# Patient Record
Sex: Male | Born: 1960 | Race: White | Hispanic: No | Marital: Married | Smoking: Current every day smoker
Health system: Southern US, Community
[De-identification: ages and names within clinical notes are randomized; demographics above are authoritative.]

## PROBLEM LIST (undated history)

## (undated) DIAGNOSIS — R51 Headache: Secondary | ICD-10-CM

## (undated) DIAGNOSIS — R519 Headache, unspecified: Secondary | ICD-10-CM

## (undated) HISTORY — DX: Headache: R51

## (undated) HISTORY — PX: CLUB FOOT RELEASE: SHX1363

## (undated) HISTORY — DX: Headache, unspecified: R51.9

---

## 2012-11-17 ENCOUNTER — Ambulatory Visit (INDEPENDENT_AMBULATORY_CARE_PROVIDER_SITE_OTHER): Payer: Managed Care, Other (non HMO) | Admitting: Family Medicine

## 2012-11-17 VITALS — BP 120/78 | HR 76 | Temp 98.6°F | Resp 16 | Ht 69.0 in | Wt 178.0 lb

## 2012-11-17 DIAGNOSIS — H1132 Conjunctival hemorrhage, left eye: Secondary | ICD-10-CM

## 2012-11-17 DIAGNOSIS — H1130 Conjunctival hemorrhage, unspecified eye: Secondary | ICD-10-CM

## 2012-11-17 NOTE — Patient Instructions (Addendum)
You appear to have a subconjunctival hemorrhage. These are generally harmless, but can take a few weeks to resolve.  If you are not getting yearly eye exams it would be wise to start.   Let me know if you have any other problems or any vision changes  Subconjunctival Hemorrhage A subconjunctival hemorrhage is a bright red patch covering a portion of the white of the eye. The white part of the eye is called the sclera, and it is covered by a thin membrane called the conjunctiva. This membrane is clear, except for tiny blood vessels that you can see with the naked eye. When your eye is irritated or inflamed and becomes red, it is because the vessels in the conjunctiva are swollen. Sometimes, a blood vessel in the conjunctiva can break and bleed. When this occurs, the blood builds up between the conjunctiva and the sclera, and spreads out to create a red area. The red spot may be very small at first. It may then spread to cover a larger part of the surface of the eye, or even all of the visible white part of the eye. In almost all cases, the blood will go away and the eye will become white again. Before completely dissolving, however, the red area may spread. It may also become brownish-yellow in color, before going away. If a lot of blood collects under the conjunctiva, it may look like a bulge on the surface of the eye. This looks scary, but it will also eventually flatten out and go away. Subconjunctival hemorrhages do not cause pain, but if swollen, may cause a feeling of irritation. There is no effect on vision.  CAUSES   The most common cause is mild trauma (rubbing the eye, irritation).  Subconjunctival hemorrhages can happen because of coughing or straining (lifting heavy objects), vomiting, or sneezing.  In some cases, your doctor may want to check your blood pressure. High blood pressure can also cause a sunconjunctival hemorrhage.  Severe trauma or blunt injuries.  Diseases that affect blood  clotting (hemophilia, leukemia).  Abnormalities of blood vessels behind the eye (carotid cavernous sinus fistula).  Tumors behind the eye.  Certain drugs (aspirin, coumadin, heparin).  Recent eye surgery. HOME CARE INSTRUCTIONS   Do not worry about the appearance of your eye. You may continue your usual activities.  Often, follow-up is not necessary. SEEK MEDICAL CARE IF:   Your eye becomes painful.  The bleeding does not disappear within 3 weeks.  Bleeding occurs elsewhere, for example, under the skin, in the mouth, or in the other eye.  You have recurring subconjunctival hemorrhages. SEEK IMMEDIATE MEDICAL CARE IF:   Your vision changes or you have difficulty seeing.  You develop severe headache, persistent vomiting, confusion, or abnormal drowsiness (lethargy).  Your eye seems to bulge or protrude from the eye socket.  You notice the sudden appearance of bruises, or have spontaneous bleeding elsewhere on your body. Document Released: 07/16/2005 Document Revised: 10/08/2011 Document Reviewed: 06/13/2009 [facility] Patient Information 2013 [place], [place].

## 2012-11-17 NOTE — Progress Notes (Signed)
[facility] [address], [place] [address] [phone]  Date:  11/17/2012   Name:  Stephen Krause   DOB:  10/27/1960   MRN:  [id number]  PCP:  No PCP Per Patient    Chief Complaint: red eye   History of Present Illness:  Stephen Krause is a 52 y.o. very pleasant male patient who presents with the following:  Last week he was doing some sanding and got some metal dust in his left eye- he thought he was able to blink it all out and did not notice any pain or persistent FB sensation.  The next day the eye seemed fine.  However Saturday am he noted some redness in just the left eye which has persisted.  The eye is not painful.  Today is Monday The eye feels a little bit irritated but nothing severe He uses reading glasses only, no rx lenses.  He does get annual eye exmas No crusting, no discharge No vision change He does have a little photophobia but no severe  He is otherwise generally healthy, has not yet tried any drops or other treatment.   His daughter and wife have been ill redently, but he has been feeling otherwise fine.    There is no problem list on file for this patient.   No past medical history on file.  Past Surgical History  Procedure Laterality Date  . Club foot release      History  Substance Use Topics  . Smoking status: Current Every Day Smoker -- 1.50 packs/day    Types: Cigarettes  . Smokeless tobacco: Not on file  . Alcohol Use: No    No family history on file.  Allergies  Allergen Reactions  . Erythromycin   . Penicillins     Medication list has been reviewed and updated.  No current outpatient prescriptions on file prior to visit.   No current facility-administered medications on file prior to visit.    Review of Systems:  As per HPI- otherwise negative. Generally healthy except for smoking  Physical Examination: Filed Vitals:   11/17/12 1405  BP: 120/78  Pulse: 76  Temp: 98.6 F (37 C)  Resp: 16    Filed Vitals:   11/17/12 1405  Height: 5\' 9"  (1.753 m)  Weight: 178 lb (80.74 kg)   Body mass index is 26.27 kg/(m^2). Ideal Body Weight: Weight in (lb) to have BMI = 25: 168.9  GEN: WDWN, NAD, Non-toxic, A & O x 3 HEENT: Atraumatic, Normocephalic. Neck supple. No masses, No LAD.  TM wnl bilaterally, oropharynx wnl Ears and Nose: No external deformity. CV: RRR, No M/G/R. No JVD. No thrill. No extra heart sounds. PULM: CTA B, no wheezes, crackles, rhonchi. No retractions. No resp. distress. No accessory muscle use. EXTR: No c/c/e NEURO Normal gait.  PSYCH: Normally interactive. Conversant. Not depressed or anxious appearing.  Calm demeanor.  Left eye: subconjunctival hemorrhage in the nasal/ inferior part of the sclera. Bilaterally: PEERL, EOMI, fundoscopic exam normal. No lid swelling or edema Negative fluorescin left eye  Assessment and Plan: Subconjunctival hemorrhage, left  Reassurance, anticipatory guidance.  He is not taking any blood thinners, no other unusual bleeding or bruising.  Continue annual exam exams.  Let me know if not getting better or if getting worse.    Signed Abbe Amsterdam, MD

## 2013-10-15 ENCOUNTER — Telehealth: Payer: Self-pay | Admitting: *Deleted

## 2013-10-15 NOTE — Telephone Encounter (Signed)
Called and spoke with patient to confirm new patient appointment for tomorrow. Patient confirmed. JG//CMA

## 2013-10-16 ENCOUNTER — Encounter: Payer: Self-pay | Admitting: Family Medicine

## 2013-10-16 ENCOUNTER — Telehealth: Payer: Self-pay

## 2013-10-16 ENCOUNTER — Ambulatory Visit (INDEPENDENT_AMBULATORY_CARE_PROVIDER_SITE_OTHER): Payer: Private Health Insurance - Indemnity | Admitting: Family Medicine

## 2013-10-16 VITALS — BP 118/84 | HR 74 | Temp 98.1°F | Resp 16 | Ht 70.0 in | Wt 182.1 lb

## 2013-10-16 DIAGNOSIS — M62838 Other muscle spasm: Secondary | ICD-10-CM | POA: Insufficient documentation

## 2013-10-16 DIAGNOSIS — M25512 Pain in left shoulder: Secondary | ICD-10-CM | POA: Insufficient documentation

## 2013-10-16 DIAGNOSIS — F17200 Nicotine dependence, unspecified, uncomplicated: Secondary | ICD-10-CM

## 2013-10-16 DIAGNOSIS — M25519 Pain in unspecified shoulder: Secondary | ICD-10-CM

## 2013-10-16 DIAGNOSIS — L989 Disorder of the skin and subcutaneous tissue, unspecified: Secondary | ICD-10-CM

## 2013-10-16 LAB — CBC WITH DIFFERENTIAL/PLATELET
BASOS ABS: 0 10*3/uL (ref 0.0–0.1)
Basophils Relative: 0.5 % (ref 0.0–3.0)
Eosinophils Absolute: 0.2 10*3/uL (ref 0.0–0.7)
Eosinophils Relative: 3.1 % (ref 0.0–5.0)
HCT: 45.9 % (ref 39.0–52.0)
Hemoglobin: 15.2 g/dL (ref 13.0–17.0)
Lymphocytes Relative: 37.7 % (ref 12.0–46.0)
Lymphs Abs: 2.8 10*3/uL (ref 0.7–4.0)
MCHC: 33.1 g/dL (ref 30.0–36.0)
MCV: 92 fl (ref 78.0–100.0)
MONOS PCT: 6.7 % (ref 3.0–12.0)
Monocytes Absolute: 0.5 10*3/uL (ref 0.1–1.0)
Neutro Abs: 3.9 10*3/uL (ref 1.4–7.7)
Neutrophils Relative %: 52 % (ref 43.0–77.0)
PLATELETS: 242 10*3/uL (ref 150.0–400.0)
RBC: 5 Mil/uL (ref 4.22–5.81)
RDW: 13.8 % (ref 11.5–14.6)
WBC: 7.4 10*3/uL (ref 4.5–10.5)

## 2013-10-16 LAB — BASIC METABOLIC PANEL
BUN: 13 mg/dL (ref 6–23)
CO2: 28 meq/L (ref 19–32)
Calcium: 9.5 mg/dL (ref 8.4–10.5)
Chloride: 103 mEq/L (ref 96–112)
Creatinine, Ser: 0.9 mg/dL (ref 0.4–1.5)
GFR: 95.19 mL/min (ref >60.00)
GLUCOSE: 86 mg/dL (ref 70–99)
POTASSIUM: 4 meq/L (ref 3.5–5.1)
SODIUM: 138 meq/L (ref 135–145)

## 2013-10-16 LAB — LIPID PANEL
CHOL/HDL RATIO: 2
Cholesterol: 149 mg/dL (ref 0–200)
HDL: 62.4 mg/dL (ref >39.00)
LDL Cholesterol: 75 mg/dL (ref 0–99)
Triglycerides: 57 mg/dL (ref 0.0–149.0)
VLDL: 11.4 mg/dL (ref 0.0–40.0)

## 2013-10-16 LAB — HEPATIC FUNCTION PANEL
ALBUMIN: 4.5 g/dL (ref 3.5–5.2)
ALK PHOS: 98 U/L (ref 39–117)
ALT: 26 U/L (ref 0–53)
AST: 25 U/L (ref 0–37)
Bilirubin, Direct: 0 mg/dL (ref 0.0–0.3)
Total Bilirubin: 0.3 mg/dL (ref 0.3–1.2)
Total Protein: 7.1 g/dL (ref 6.0–8.3)

## 2013-10-16 LAB — TSH: TSH: 0.88 u[IU]/mL (ref 0.35–5.50)

## 2013-10-16 MED ORDER — CYCLOBENZAPRINE HCL 10 MG PO TABS: 10.0000 mg | ORAL_TABLET | Freq: Three times a day (TID) | ORAL | Status: AC | PRN

## 2013-10-16 MED ORDER — MELOXICAM 15 MG PO TABS: 15.0000 mg | ORAL_TABLET | Freq: Every day | ORAL | Status: AC

## 2013-10-16 NOTE — Telephone Encounter (Signed)
Left message for call back Non-identifiable   NEW PATIENT 

## 2013-10-16 NOTE — Progress Notes (Signed)
   Subjective:    Patient ID: Stephen Krause, male    DOB: Aug 12, 1960, 53 y.o.   MRN: [id number]  HPI New to establish.  Previous PCP- [facility] She[facility]  Neck stiffness- intermittent, will cause HAs.  Central.  Pt feels sxs are worse w/ dehydration.  sxs tend to occur every 2 weeks.  Improves w/ NSAID use.  L shoulder- injured arm last summer while moving trees.  Saw [facility], xrays were normal and pt got home exercise program.  Still having pain w/ overhead motion, putting on a coat, etc.  Skin lesion- on R thigh, not painful.  Not growing but 'is changing'- becoming rough and protruding from skin.  No bleeding.  Not itchy.  + family hx of melanoma.  Area is 'reddish' in color.  Tobacco use- smokes 2 ppd x20 yrs.  Thinking about quitting.  Denies CP, SOB, chronic cough   Review of Systems For ROS see HPI     Objective:   Physical Exam  Vitals reviewed. Constitutional: He is oriented to person, place, and time. He appears well-developed and well-nourished. No distress.  Smells of tobacco  Neck: Normal range of motion. Neck supple. No thyromegaly present.  Mild trap spasm bilaterally  Cardiovascular: Normal rate, regular rhythm, normal heart sounds and intact distal pulses.   Pulmonary/Chest: Effort normal and breath sounds normal. No respiratory distress. He has no wheezes. He has no rales.  Musculoskeletal:  L shoulder w/ painful abduction, external rotation > internal rotation  Lymphadenopathy:    He has no cervical adenopathy.  Neurological: He is alert and oriented to person, place, and time. No cranial nerve deficit. Coordination normal.  Skin: Skin is warm and dry.  <0.5 cm keratosis on R anterior thigh- not at all suspicious for melanoma  Psychiatric: He has a normal mood and affect. His behavior is normal.          Assessment & Plan:

## 2013-10-16 NOTE — Progress Notes (Signed)
Pre visit review using our clinic review tool, if applicable. No additional management support is needed unless otherwise documented below in the visit note. 

## 2013-10-16 NOTE — Patient Instructions (Signed)
Schedule your complete physical in 6 months We'll notify you of your lab results and make any changes if needed We'll call you with your Orthopedic appt Start the Mobic daily for pain and inflammation Use the Flexeril at night for muscle spasm- will cause drowsiness Heating pad on neck or shoulder as needed Try and quit smoking! Call with any questions or concerns Welcome!  We're glad to have you!

## 2013-10-16 NOTE — Assessment & Plan Note (Signed)
New to provider, ongoing for pt.  He is now smoking 2 ppd.  Pt reports he is interested in quitting but is not interested in medication to assist at this time.  Will follow and address at future visits.

## 2013-10-16 NOTE — Assessment & Plan Note (Signed)
New to provider, ongoing for pt.  Has seen [facility] and done home exercise program but he continues to have pain and limited ROM.  Start daily NSAID.  Refer back to [facility].  Will follow.

## 2013-10-16 NOTE — Assessment & Plan Note (Signed)
New.  No evidence of melanoma.  Appears to be keratosis.  No need for intervention at this time.  Pt to monitor for change.

## 2013-10-16 NOTE — Assessment & Plan Note (Signed)
New.  Start scheduled daily NSAIDs, add flexeril prn.  Heat prn.  Reviewed supportive care and red flags that should prompt return.  Pt expressed understanding and is in agreement w/ plan.

## 2013-10-19 ENCOUNTER — Telehealth: Payer: Self-pay | Admitting: Family Medicine

## 2013-10-19 ENCOUNTER — Encounter: Payer: Self-pay | Admitting: General Practice

## 2013-10-19 NOTE — Telephone Encounter (Signed)
Relevant patient education mailed to patient.  

## 2013-10-21 NOTE — Telephone Encounter (Signed)
Unable to reach the patient pre visit.

## 2021-10-03 ENCOUNTER — Other Ambulatory Visit (HOSPITAL_BASED_OUTPATIENT_CLINIC_OR_DEPARTMENT_OTHER): Payer: Self-pay

## 2021-10-03 MED ORDER — AZITHROMYCIN 250 MG PO TABS
ORAL_TABLET | ORAL | 0 refills | Status: AC
  Filled 2021-10-03: qty 6, 5d supply, fill #0

## 2021-10-10 ENCOUNTER — Other Ambulatory Visit: Payer: Self-pay | Admitting: *Deleted

## 2021-10-10 DIAGNOSIS — Z87891 Personal history of nicotine dependence: Secondary | ICD-10-CM

## 2021-10-10 DIAGNOSIS — F17210 Nicotine dependence, cigarettes, uncomplicated: Secondary | ICD-10-CM

## 2021-10-24 ENCOUNTER — Encounter: Payer: Self-pay | Admitting: Acute Care

## 2021-10-24 ENCOUNTER — Ambulatory Visit (INDEPENDENT_AMBULATORY_CARE_PROVIDER_SITE_OTHER): Payer: Managed Care, Other (non HMO) | Admitting: Acute Care

## 2021-10-24 DIAGNOSIS — F17210 Nicotine dependence, cigarettes, uncomplicated: Secondary | ICD-10-CM

## 2021-10-24 NOTE — Progress Notes (Signed)
Virtual Visit via Telephone Note ? ?I connected with Stephen Krause on 10/24/21 at 11:00 AM EDT by telephone and verified that I am speaking with the correct person using two identifiers. ? ?Location: ?Patient:  At home ?Provider: Working from home ?  ?I discussed the limitations, risks, security and privacy concerns of performing an evaluation and management service by telephone and the availability of in person appointments. I also discussed with the patient that there may be a patient responsible charge related to this service. The patient expressed understanding and agreed to proceed. ? ? ? ?Shared Decision Making Visit Lung Cancer Screening Program ?[phone]) ? ? ?Eligibility: ?Age 61 y.o. ?Pack Years Smoking History Calculation 40 pack year smoking history ?(# packs/per year x # years smoked) ?Recent History of coughing up blood  no ?Unexplained weight loss? no ?( >Than 15 pounds within the last 6 months ) ?Prior History Lung / other cancer no ?(Diagnosis within the last 5 years already requiring surveillance chest CT Scans). ?Smoking Status Current Smoker ?Former Smokers: Years since quit:  NA ? Quit Date:  NA ? ?Visit Components: ?Discussion included one or more decision making aids. yes ?Discussion included risk/benefits of screening. yes ?Discussion included potential follow up diagnostic testing for abnormal scans. yes ?Discussion included meaning and risk of over diagnosis. yes ?Discussion included meaning and risk of False Positives. yes ?Discussion included meaning of total radiation exposure. yes ? ?Counseling Included: ?Importance of adherence to annual lung cancer LDCT screening. yes ?Impact of comorbidities on ability to participate in the program. yes ?Ability and willingness to under diagnostic treatment. yes ? ?Smoking Cessation Counseling: ?Current Smokers:  ?Discussed importance of smoking cessation. yes ?Information about tobacco cessation classes and interventions provided to patient.  yes ?Patient provided with "ticket" for LDCT Scan. yes ?Symptomatic Patient. no ? Counseling NA ?Diagnosis Code: Tobacco Use Z72.0 ?Asymptomatic Patient yes ? Counseling (Intermediate counseling: > three minutes counseling) [id number] ?Former Smokers:  ?Discussed the importance of maintaining cigarette abstinence. yes ?Diagnosis Code: Personal History of Nicotine Dependence. U98.119Z87.891 ?Information about tobacco cessation classes and interventions provided to patient. Yes ?Patient provided with "ticket" for LDCT Scan. yes ?Written Order for Lung Cancer Screening with LDCT placed in Epic. Yes ?(CT Chest Lung Cancer Screening Low Dose W/O CM) [id number] ?Z12.2-Screening of respiratory organs ?Z87.891-Personal history of nicotine dependence ? ? ?I have spent 25 minutes of face to face/ virtual visit   time with Mr. Stephen Krause discussing the risks and benefits of lung cancer screening. We viewed / discussed a power point together that explained in detail the above noted topics. We paused at intervals to allow for questions to be asked and answered to ensure understanding.We discussed that the single most powerful action that he can take to decrease his risk of developing lung cancer is to quit smoking. We discussed whether or not he is ready to commit to setting a quit date. We discussed options for tools to aid in quitting smoking including nicotine replacement therapy, non-nicotine medications, support groups, Quit Smart classes, and behavior modification. We discussed that often times setting smaller, more achievable goals, such as eliminating 1 cigarette a day for a week and then 2 cigarettes a day for a week can be helpful in slowly decreasing the number of cigarettes smoked. This allows for a sense of accomplishment as well as providing a clinical benefit. I provided  him  with smoking cessation  information  with contact information for community resources, classes, free nicotine replacement therapy, and access to  mobile apps,  text messaging, and on-line smoking cessation help. I have also provided  him  the office contact information in the event he needs to contact me, or the screening staff. We discussed the time and location of the scan, and that either Doroteo Glassman RN, Joella Prince, RN  or I will call / send a letter with the results within 24-72 hours of receiving them. The patient verbalized understanding of all of  the above and had no further questions upon leaving the office. They have my contact information in the event they have any further questions. ? ?I spent 3 minutes counseling on smoking cessation and the health risks of continued tobacco abuse. ? ?I explained to the patient that there has been a high incidence of coronary artery disease noted on these exams. I explained that this is a non-gated exam therefore degree or severity cannot be determined. This patient is not on statin therapy. I have asked the patient to follow-up with their PCP regarding any incidental finding of coronary artery disease and management with diet or medication as their PCP  feels is clinically indicated. The patient verbalized understanding of the above and had no further questions upon completion of the visit. ? ?  ? ? ? ?Magdalen Spatz, NP ?10/24/2021 ? ? ? ? ? ? ?

## 2021-10-24 NOTE — Patient Instructions (Signed)
Thank you for participating in the [facility] Cancer Screening Program. °It was our pleasure to meet you today. °We will call you with the results of your scan within the next few days. °Your scan will be assigned a Lung RADS category score by the physicians reading the scans.  °This Lung RADS score determines follow up scanning.  °See below for description of categories, and follow up screening recommendations. °We will be in touch to schedule your follow up screening annually or based on recommendations of our providers. °We will fax a copy of your scan results to your Primary Care Physician, or the physician who referred you to the program, to ensure they have the results. °Please call the office if you have any questions or concerns regarding your scanning experience or results.  °Our office number is [phone]. °Please speak with Denise Phelps, RN. She is our Lung Cancer Screening RN. °If she is unavailable when you call, please have the office staff send her a message. She will return your call at her earliest convenience. °Remember, if your scan is normal, we will scan you annually as long as you continue to meet the criteria for the program. (Age 55-77, Current smoker or smoker who has quit within the last 15 years). °If you are a smoker, remember, quitting is the single most powerful action that you can take to decrease your risk of lung cancer and other pulmonary, breathing related problems. °We know quitting is hard, and we are here to help.  °Please let us know if there is anything we can do to help you meet your goal of quitting. °If you are a former smoker, congratulations. We are proud of you! Remain smoke free! °Remember you can refer friends or family members through the number above.  °We will screen them to make sure they meet criteria for the program. °Thank you for helping us take better care of you by participating in Lung Screening. ° °You can receive free nicotine replacement therapy  ( patches, gum or mints) by calling 1-800-QUIT NOW. Please call so we can get you on the path to becoming  a non-smoker. I know it is hard, but you can do this! ° °Lung RADS Categories: ° °Lung RADS 1: no nodules or definitely non-concerning nodules.  °Recommendation is for a repeat annual scan in 12 months. ° °Lung RADS 2:  nodules that are non-concerning in appearance and behavior with a very low likelihood of becoming an active cancer. °Recommendation is for a repeat annual scan in 12 months. ° °Lung RADS 3: nodules that are probably non-concerning , includes nodules with a low likelihood of becoming an active cancer.  Recommendation is for a 6-month repeat screening scan. Often noted after an upper respiratory illness. We will be in touch to make sure you have no questions, and to schedule your 6-month scan. ° °Lung RADS 4 A: nodules with concerning findings, recommendation is most often for a follow up scan in 3 months or additional testing based on our provider's assessment of the scan. We will be in touch to make sure you have no questions and to schedule the recommended 3 month follow up scan. ° °Lung RADS 4 B:  indicates findings that are concerning. We will be in touch with you to schedule additional diagnostic testing based on our provider's  assessment of the scan. ° °Hypnosis for smoking cessation  °[employer] °[phone] ° °Acupuncture for smoking cessation  °[facility] °[phone]  °

## 2021-10-25 ENCOUNTER — Ambulatory Visit
Admission: RE | Admit: 2021-10-25 | Discharge: 2021-10-25 | Disposition: A | Discharge status: Home / Self Care | Payer: Managed Care, Other (non HMO) | Source: Ambulatory Visit | Attending: Acute Care | Admitting: Acute Care

## 2021-10-25 DIAGNOSIS — Z87891 Personal history of nicotine dependence: Secondary | ICD-10-CM

## 2021-10-25 DIAGNOSIS — F17210 Nicotine dependence, cigarettes, uncomplicated: Secondary | ICD-10-CM

## 2021-10-27 ENCOUNTER — Other Ambulatory Visit: Payer: Self-pay | Admitting: Acute Care

## 2021-10-27 DIAGNOSIS — F17210 Nicotine dependence, cigarettes, uncomplicated: Secondary | ICD-10-CM

## 2022-02-12 ENCOUNTER — Other Ambulatory Visit (HOSPITAL_BASED_OUTPATIENT_CLINIC_OR_DEPARTMENT_OTHER): Payer: Self-pay

## 2022-02-12 MED ORDER — SULFAMETHOXAZOLE-TRIMETHOPRIM 800-160 MG PO TABS
ORAL_TABLET | ORAL | 0 refills | Status: AC
  Filled 2022-02-12: qty 14, 7d supply, fill #0

## 2022-09-11 ENCOUNTER — Other Ambulatory Visit (HOSPITAL_BASED_OUTPATIENT_CLINIC_OR_DEPARTMENT_OTHER): Payer: Self-pay

## 2022-09-11 MED ORDER — NICOTINE 21-14-7 MG/24HR TD KIT
PACK | TRANSDERMAL | 0 refills | Status: AC
  Filled 2022-09-11: qty 1, 30d supply, fill #0

## 2022-09-11 MED ORDER — BUPROPION HCL ER (SR) 150 MG PO TB12
150.0000 mg | ORAL_TABLET | Freq: Every morning | ORAL | 0 refills | Status: AC
  Filled 2022-09-11: qty 60, 60d supply, fill #0

## 2022-09-12 ENCOUNTER — Other Ambulatory Visit (HOSPITAL_BASED_OUTPATIENT_CLINIC_OR_DEPARTMENT_OTHER): Payer: Self-pay

## 2022-09-25 ENCOUNTER — Other Ambulatory Visit (HOSPITAL_BASED_OUTPATIENT_CLINIC_OR_DEPARTMENT_OTHER): Payer: Self-pay

## 2022-10-26 ENCOUNTER — Ambulatory Visit
Admission: RE | Admit: 2022-10-26 | Discharge: 2022-10-26 | Disposition: A | Discharge status: Home / Self Care | Payer: Commercial Managed Care - HMO | Source: Ambulatory Visit | Attending: Acute Care | Admitting: Acute Care

## 2022-10-26 DIAGNOSIS — F17210 Nicotine dependence, cigarettes, uncomplicated: Secondary | ICD-10-CM

## 2022-10-30 ENCOUNTER — Other Ambulatory Visit: Payer: Self-pay

## 2022-10-30 DIAGNOSIS — Z87891 Personal history of nicotine dependence: Secondary | ICD-10-CM

## 2022-10-30 DIAGNOSIS — F17210 Nicotine dependence, cigarettes, uncomplicated: Secondary | ICD-10-CM

## 2023-04-20 IMAGING — CT CT CHEST LUNG CANCER SCREENING LOW DOSE W/O CM
2 of 5 series · 15 of 40 positions shown, 18 images · non-contrast
Comparison: None.

CLINICAL DATA: Fifty pack-year smoking history/current smoker.



[Series 4: lung 1.00 br44 cor · coronal · 0.76mm/px · 3 of 243 slices shown]
[im 49/243  lung]
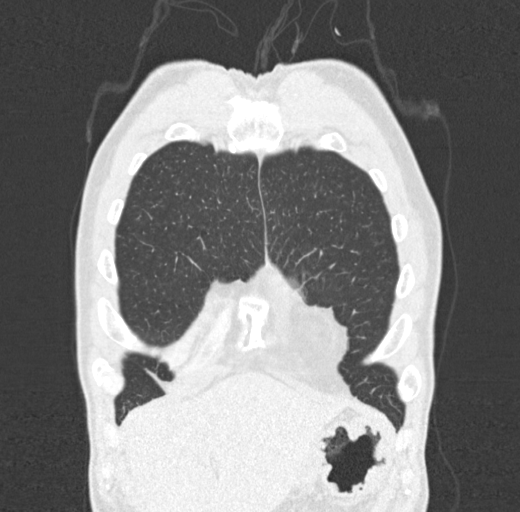
[im 97/243  lung]
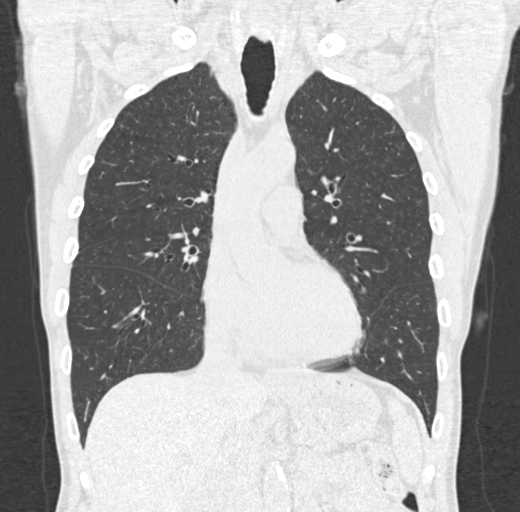
[im 146/243  lung]
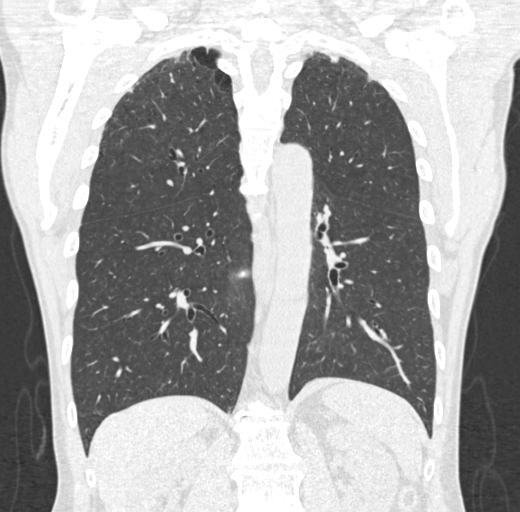

[Series 9: lung 1.00 br60 axial · axial · 0.77mm/px · z∈[-1247,-893]mm · 12 of 390 slices shown, 15 images]
[im 18/390  mediastinal]
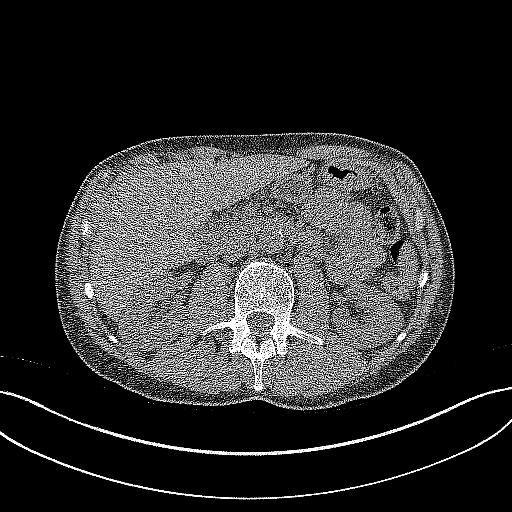
[im 18/390  lung]
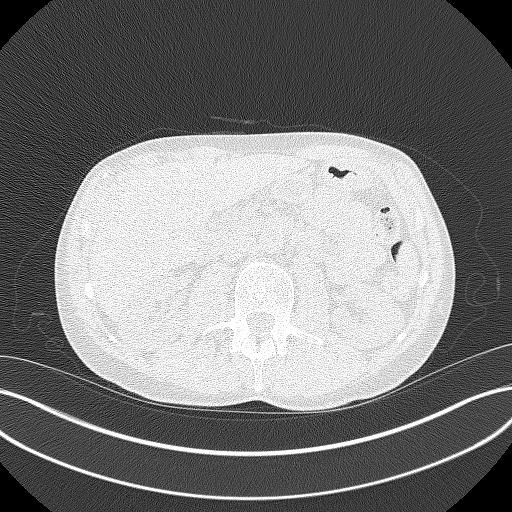
[im 54/390  lung]
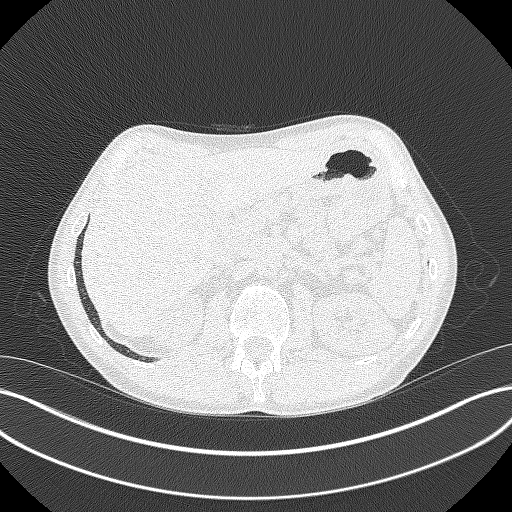
[im 89/390  lung]
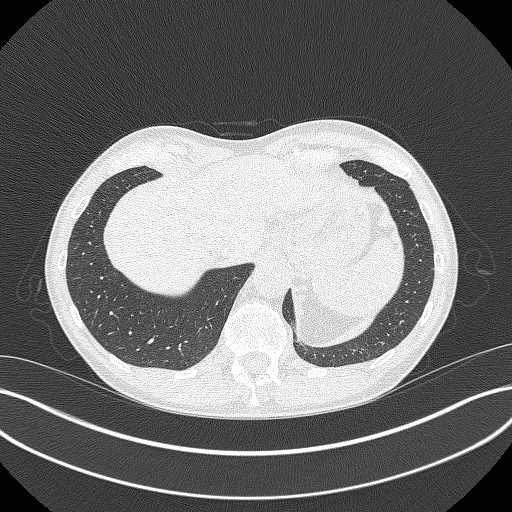
[im 124/390  lung]
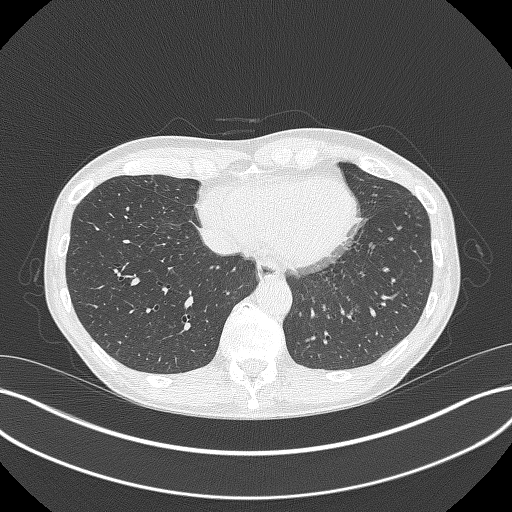
[im 142/390  mediastinal]
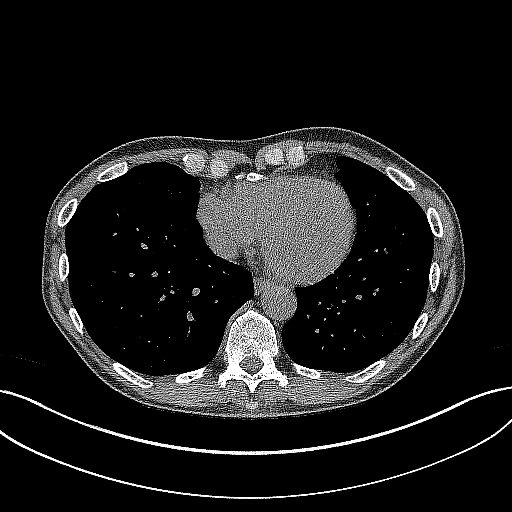
[im 142/390  lung]
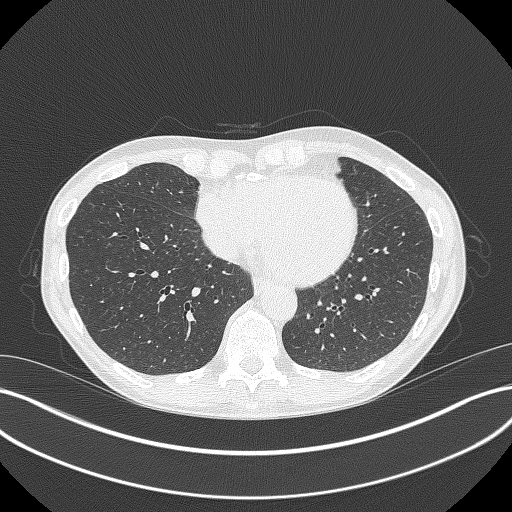
[im 177/390  lung]
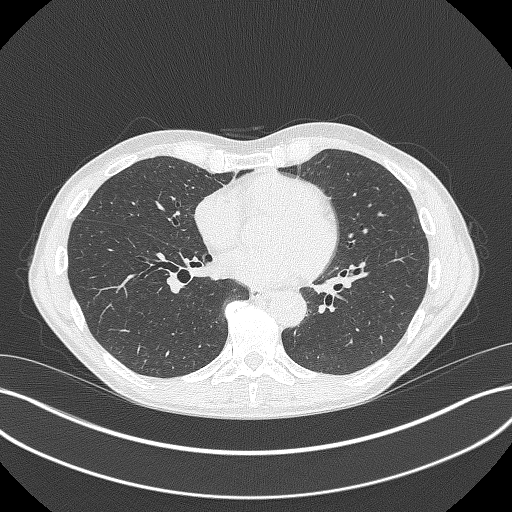
[im 213/390  lung]
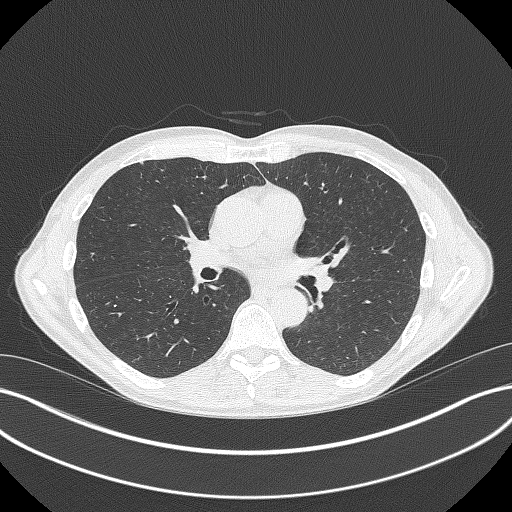
[im 248/390  lung]
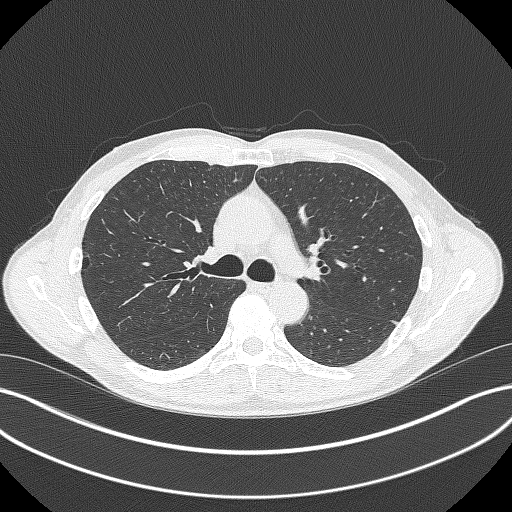
[im 266/390  mediastinal]
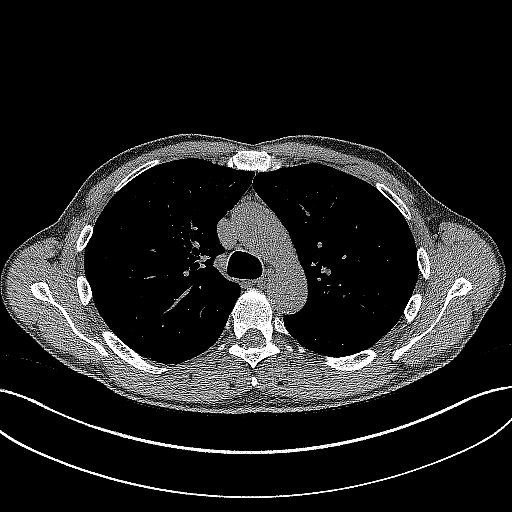
[im 266/390  lung]
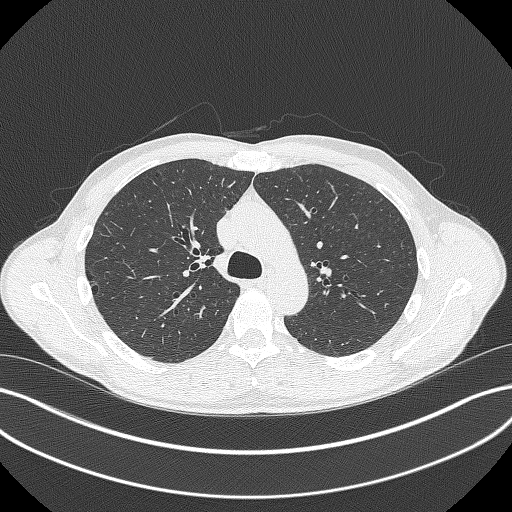
[im 301/390  lung]
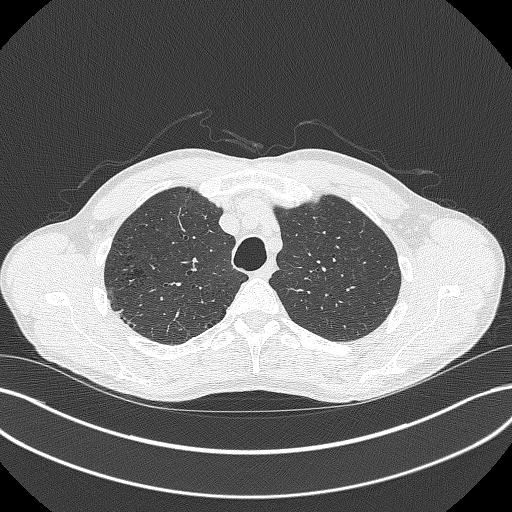
[im 336/390  lung]
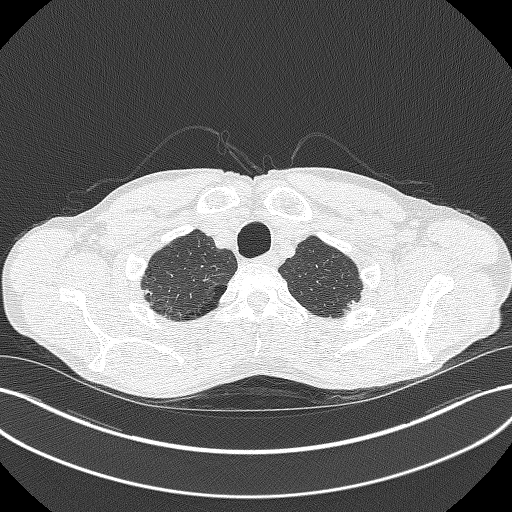
[im 372/390  lung]
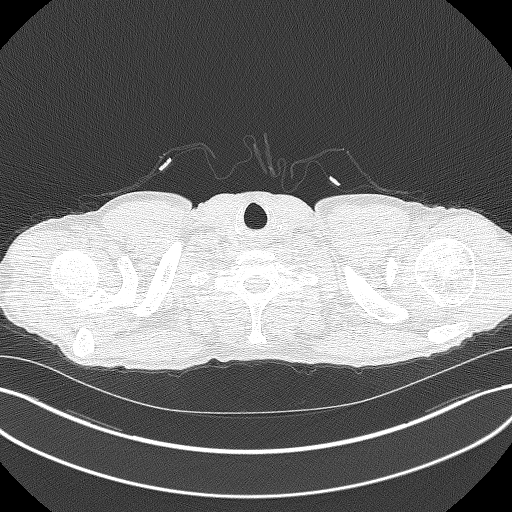

[15 of 40 positions shown; findings below may reference images not displayed]

FINDINGS: Cardiovascular: Aortic atherosclerosis. Heart size accentuated by a
pectus excavatum deformity.

Mediastinum/Nodes: No mediastinal or definite hilar adenopathy,
given limitations of unenhanced CT.

Lungs/Pleura: No pleural fluid. Moderate centrilobular and
paraseptal emphysema, primarily at the apices. Probable secretions
within the trachea. Biapical pleuroparenchymal scarring.

Pulmonary nodules of maximally volume derived equivalent diameter
5.0 mm.

Upper Abdomen: Normal imaged portions of the liver, spleen, stomach,
pancreas, adrenal glands, kidneys. Abdominal aortic atherosclerosis.

Musculoskeletal: Lower thoracic spondylosis.
IMPRESSION: Lung-RADS 2, benign appearance or behavior. Continue annual
screening with low-dose chest CT without contrast in 12 months.

Aortic Atherosclerosis (ZG7K2-VVD.D) and Emphysema (ZG7K2-SBH.4).

## 2023-11-07 ENCOUNTER — Encounter: Payer: Self-pay | Admitting: Acute Care

## 2023-11-20 ENCOUNTER — Other Ambulatory Visit: Payer: Self-pay | Admitting: Acute Care

## 2023-11-20 DIAGNOSIS — F17210 Nicotine dependence, cigarettes, uncomplicated: Secondary | ICD-10-CM

## 2023-11-20 DIAGNOSIS — Z87891 Personal history of nicotine dependence: Secondary | ICD-10-CM

## 2023-12-04 ENCOUNTER — Other Ambulatory Visit

## 2024-03-03 ENCOUNTER — Ambulatory Visit: Admitting: Family

## 2024-03-23 ENCOUNTER — Ambulatory Visit: Admitting: Family Medicine

## 2024-03-23 DIAGNOSIS — Z0000 Encounter for general adult medical examination without abnormal findings: Secondary | ICD-10-CM
